# Patient Record
Sex: Male | Born: 1995 | Race: White | Hispanic: No | Smoking: Never smoker
Health system: Southern US, Community
[De-identification: ages and names within clinical notes are randomized; demographics above are authoritative.]

## PROBLEM LIST (undated history)

## (undated) ENCOUNTER — Ambulatory Visit: Admission: EM | Source: Home / Self Care

## (undated) DIAGNOSIS — M069 Rheumatoid arthritis, unspecified: Secondary | ICD-10-CM

## (undated) DIAGNOSIS — K5100 Ulcerative (chronic) pancolitis without complications: Secondary | ICD-10-CM

## (undated) HISTORY — DX: Rheumatoid arthritis, unspecified: M06.9

## (undated) HISTORY — PX: COLONOSCOPY: SHX174

---
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
## 2017-02-07 DIAGNOSIS — M2550 Pain in unspecified joint: Secondary | ICD-10-CM | POA: Diagnosis not present

## 2017-02-07 DIAGNOSIS — K51818 Other ulcerative colitis with other complication: Secondary | ICD-10-CM | POA: Diagnosis not present

## 2017-03-13 DIAGNOSIS — F411 Generalized anxiety disorder: Secondary | ICD-10-CM | POA: Diagnosis not present

## 2017-03-13 DIAGNOSIS — F321 Major depressive disorder, single episode, moderate: Secondary | ICD-10-CM | POA: Diagnosis not present

## 2017-03-27 DIAGNOSIS — F321 Major depressive disorder, single episode, moderate: Secondary | ICD-10-CM | POA: Diagnosis not present

## 2017-03-27 DIAGNOSIS — F411 Generalized anxiety disorder: Secondary | ICD-10-CM | POA: Diagnosis not present

## 2017-04-24 DIAGNOSIS — F411 Generalized anxiety disorder: Secondary | ICD-10-CM | POA: Diagnosis not present

## 2017-04-24 DIAGNOSIS — F321 Major depressive disorder, single episode, moderate: Secondary | ICD-10-CM | POA: Diagnosis not present

## 2017-04-26 DIAGNOSIS — R300 Dysuria: Secondary | ICD-10-CM | POA: Diagnosis not present

## 2017-04-26 DIAGNOSIS — N390 Urinary tract infection, site not specified: Secondary | ICD-10-CM | POA: Diagnosis not present

## 2017-05-11 DIAGNOSIS — F321 Major depressive disorder, single episode, moderate: Secondary | ICD-10-CM | POA: Diagnosis not present

## 2017-05-11 DIAGNOSIS — F411 Generalized anxiety disorder: Secondary | ICD-10-CM | POA: Diagnosis not present

## 2017-05-25 DIAGNOSIS — F411 Generalized anxiety disorder: Secondary | ICD-10-CM | POA: Diagnosis not present

## 2017-05-25 DIAGNOSIS — F321 Major depressive disorder, single episode, moderate: Secondary | ICD-10-CM | POA: Diagnosis not present

## 2017-06-07 DIAGNOSIS — K51818 Other ulcerative colitis with other complication: Secondary | ICD-10-CM | POA: Diagnosis not present

## 2017-06-07 DIAGNOSIS — M2550 Pain in unspecified joint: Secondary | ICD-10-CM | POA: Diagnosis not present

## 2017-06-07 DIAGNOSIS — M545 Low back pain: Secondary | ICD-10-CM | POA: Diagnosis not present

## 2017-06-08 DIAGNOSIS — F321 Major depressive disorder, single episode, moderate: Secondary | ICD-10-CM | POA: Diagnosis not present

## 2017-06-08 DIAGNOSIS — F411 Generalized anxiety disorder: Secondary | ICD-10-CM | POA: Diagnosis not present

## 2017-06-22 DIAGNOSIS — F321 Major depressive disorder, single episode, moderate: Secondary | ICD-10-CM | POA: Diagnosis not present

## 2017-06-22 DIAGNOSIS — F411 Generalized anxiety disorder: Secondary | ICD-10-CM | POA: Diagnosis not present

## 2017-06-27 DIAGNOSIS — F321 Major depressive disorder, single episode, moderate: Secondary | ICD-10-CM | POA: Diagnosis not present

## 2017-06-27 DIAGNOSIS — F411 Generalized anxiety disorder: Secondary | ICD-10-CM | POA: Diagnosis not present

## 2017-07-06 DIAGNOSIS — F411 Generalized anxiety disorder: Secondary | ICD-10-CM | POA: Diagnosis not present

## 2017-07-06 DIAGNOSIS — F321 Major depressive disorder, single episode, moderate: Secondary | ICD-10-CM | POA: Diagnosis not present

## 2017-07-11 DIAGNOSIS — F411 Generalized anxiety disorder: Secondary | ICD-10-CM | POA: Diagnosis not present

## 2017-07-11 DIAGNOSIS — F900 Attention-deficit hyperactivity disorder, predominantly inattentive type: Secondary | ICD-10-CM | POA: Diagnosis not present

## 2017-07-20 DIAGNOSIS — F321 Major depressive disorder, single episode, moderate: Secondary | ICD-10-CM | POA: Diagnosis not present

## 2017-07-20 DIAGNOSIS — F411 Generalized anxiety disorder: Secondary | ICD-10-CM | POA: Diagnosis not present

## 2017-07-30 DIAGNOSIS — F411 Generalized anxiety disorder: Secondary | ICD-10-CM | POA: Diagnosis not present

## 2017-07-30 DIAGNOSIS — F321 Major depressive disorder, single episode, moderate: Secondary | ICD-10-CM | POA: Diagnosis not present

## 2017-08-08 DIAGNOSIS — F321 Major depressive disorder, single episode, moderate: Secondary | ICD-10-CM | POA: Diagnosis not present

## 2017-08-08 DIAGNOSIS — F411 Generalized anxiety disorder: Secondary | ICD-10-CM | POA: Diagnosis not present

## 2017-08-15 DIAGNOSIS — F411 Generalized anxiety disorder: Secondary | ICD-10-CM | POA: Diagnosis not present

## 2017-08-15 DIAGNOSIS — F321 Major depressive disorder, single episode, moderate: Secondary | ICD-10-CM | POA: Diagnosis not present

## 2017-08-20 DIAGNOSIS — F411 Generalized anxiety disorder: Secondary | ICD-10-CM | POA: Diagnosis not present

## 2017-08-20 DIAGNOSIS — F321 Major depressive disorder, single episode, moderate: Secondary | ICD-10-CM | POA: Diagnosis not present

## 2017-09-12 DIAGNOSIS — F411 Generalized anxiety disorder: Secondary | ICD-10-CM | POA: Diagnosis not present

## 2017-09-12 DIAGNOSIS — F321 Major depressive disorder, single episode, moderate: Secondary | ICD-10-CM | POA: Diagnosis not present

## 2018-06-13 DIAGNOSIS — Z681 Body mass index (BMI) 19 or less, adult: Secondary | ICD-10-CM | POA: Diagnosis not present

## 2018-06-13 DIAGNOSIS — R509 Fever, unspecified: Secondary | ICD-10-CM | POA: Diagnosis not present

## 2018-06-13 DIAGNOSIS — H6123 Impacted cerumen, bilateral: Secondary | ICD-10-CM | POA: Diagnosis not present

## 2018-06-13 DIAGNOSIS — Z20828 Contact with and (suspected) exposure to other viral communicable diseases: Secondary | ICD-10-CM | POA: Diagnosis not present

## 2018-06-13 DIAGNOSIS — R8290 Unspecified abnormal findings in urine: Secondary | ICD-10-CM | POA: Diagnosis not present

## 2018-11-09 ENCOUNTER — Ambulatory Visit
Admission: EM | Admit: 2018-11-09 | Discharge: 2018-11-09 | Disposition: A | Discharge status: Home / Self Care | Payer: BC Managed Care – PPO | Attending: Emergency Medicine | Admitting: Emergency Medicine

## 2018-11-09 ENCOUNTER — Ambulatory Visit (INDEPENDENT_AMBULATORY_CARE_PROVIDER_SITE_OTHER): Payer: BC Managed Care – PPO

## 2018-11-09 DIAGNOSIS — S29012A Strain of muscle and tendon of back wall of thorax, initial encounter: Secondary | ICD-10-CM | POA: Diagnosis not present

## 2018-11-09 DIAGNOSIS — M546 Pain in thoracic spine: Secondary | ICD-10-CM | POA: Diagnosis not present

## 2018-11-09 DIAGNOSIS — S299XXA Unspecified injury of thorax, initial encounter: Secondary | ICD-10-CM | POA: Diagnosis not present

## 2018-11-09 MED ORDER — CYCLOBENZAPRINE HCL 5 MG PO TABS: 5.0000 mg | ORAL_TABLET | Freq: Two times a day (BID) | ORAL | 0 refills | Status: AC | PRN

## 2018-11-09 MED ORDER — PREDNISONE 10 MG (21) PO TBPK: ORAL_TABLET | Freq: Every day | ORAL | 0 refills | Status: DC

## 2018-11-09 NOTE — Discharge Instructions (Signed)
Do stretches/exercises provided. May use ice topically swelling. Use steroid pack as discussed at appointment/written. May take muscle relaxer before bedtime for severe pain/tightness. Follow-up with Ortho: Go to ER for further evaluation if you develop severe fatigue, nausea, chest pain, difficulty breathing, abdominal pain, upper or lower extremity weakness, numbness.

## 2018-11-09 NOTE — ED Triage Notes (Signed)
Pt c/o upper back pain shooting down center of back. States felt the pain while rotating tires on his car

## 2018-11-09 NOTE — ED Provider Notes (Signed)
[facility]    CSN: [id number] Arrival date & time: 11/09/18  1212     History   Chief Complaint Chief Complaint  Patient presents with  . Back Pain    HPI Dennis Mccarty is a 23 y.o. male presenting for fiery/burning midthoracic back pain between his shoulder blades that is worse when raising either arm greater than 90 degrees extension since 830 this morning after feeling a popping sensation when rotating his tires.  Patient states that he took half of his tramadol, for which he takes due to UC and RA flares.  Does endorse fatigue and mild nausea since taking tramadol, though patient states this is consistent with known side effect of medication.  Patient denies history of connective tissue disorder, family history of connective tissue disorder.  Patient denies chest pain, shortness of breath, visual changes, loss of consciousness, urinary or fecal incontinence, lower extremity paresthesias or weakness.    History reviewed. No pertinent past medical history.  There are no active problems to display for this patient.   History reviewed. No pertinent surgical history.     Home Medications    Prior to Admission medications   Medication Sig Start Date End Date Taking? Authorizing Provider  cyclobenzaprine (FLEXERIL) 5 MG tablet Take 1 tablet (5 mg total) by mouth 2 (two) times daily as needed for up to 5 days for muscle spasms. 11/09/18 11/14/18  Hall-Potvin, [place], PA-C  predniSONE (STERAPRED UNI-PAK 21 TAB) 10 MG (21) TBPK tablet Take by mouth daily. Take steroid pack as written 11/09/18   Hall-Potvin, [place], PA-C    Family History No family history on file.  Social History Social History   Tobacco Use  . Smoking status: Never Smoker  . Smokeless tobacco: Never Used  Substance Use Topics  . Alcohol use: Not Currently  . Drug use: Not on file     Allergies   Patient has no known allergies.   Review of Systems Review of Systems  Constitutional:  Negative for fatigue and fever.  Respiratory: Negative for cough and shortness of breath.   Cardiovascular: Negative for chest pain and palpitations.  Gastrointestinal: Negative for abdominal pain, diarrhea and vomiting.  Musculoskeletal: Positive for back pain. Negative for arthralgias, gait problem, myalgias and neck stiffness.  Skin: Negative for rash and wound.  Neurological: Negative for speech difficulty and headaches.  All other systems reviewed and are negative.    Physical Exam Triage Vital Signs ED Triage Vitals  Enc Vitals Group     BP      Pulse      Resp      Temp      Temp src      SpO2      Weight      Height      Head Circumference      Peak Flow      Pain Score      Pain Loc      Pain Edu?      Excl. in [place]?    No data found.  Updated Vital Signs BP 106/65 (BP Location: Left Arm)   Pulse 77   Temp 98.3 F (36.8 C) (Oral)   Resp 18   SpO2 96%   Visual Acuity Right Eye Distance:   Left Eye Distance:   Bilateral Distance:    Right Eye Near:   Left Eye Near:    Bilateral Near:     Physical Exam Vitals signs reviewed.  Constitutional:  General: He is not in acute distress.    Appearance: He is not toxic-appearing or diaphoretic.     Comments: Patient is very lean  HENT:     Head: Normocephalic and atraumatic.     Right Ear: Tympanic membrane, ear canal and external ear normal.     Left Ear: Tympanic membrane, ear canal and external ear normal.     Nose: Nose normal.     Mouth/Throat:     Mouth: Mucous membranes are moist.     Pharynx: Oropharynx is clear. No oropharyngeal exudate or posterior oropharyngeal erythema.  Eyes:     General: No scleral icterus.       Right eye: No discharge.        Left eye: No discharge.     Extraocular Movements: Extraocular movements intact.     Conjunctiva/sclera: Conjunctivae normal.     Pupils: Pupils are equal, round, and reactive to light.  Neck:     Musculoskeletal: Normal range of motion and  neck supple. No neck rigidity or muscular tenderness.  Cardiovascular:     Rate and Rhythm: Normal rate and regular rhythm.     Pulses: Normal pulses.     Comments: Patient sitting, BP: Left arm 102/64, right arm 103/67  Pulmonary:     Effort: Pulmonary effort is normal. No respiratory distress.     Comments: Decreased breath sounds bilaterally without rales, wheezing, rhonchi or prolonged expiratory phase Chest:     Chest wall: No tenderness.  Abdominal:     General: Abdomen is flat. Bowel sounds are normal.     Palpations: Abdomen is soft.     Tenderness: There is no abdominal tenderness. There is no right CVA tenderness, left CVA tenderness, guarding or rebound.  Musculoskeletal:     Comments: Limited shoulder extension bilaterally second to pain.  Moderate thoracic vertebral tenderness with left-sided paravertebral tenderness and swelling.  Grip strength 5/5, NVI.  Lower extremity strength 5/5, gait normal.  Lymphadenopathy:     Cervical: No cervical adenopathy.  Skin:    General: Skin is warm.     Capillary Refill: Capillary refill takes less than 2 seconds.     Coloration: Skin is not jaundiced or pale.     Findings: No bruising.  Neurological:     General: No focal deficit present.     Mental Status: He is alert and oriented to person, place, and time.     Cranial Nerves: No cranial nerve deficit.     Sensory: No sensory deficit.     Motor: No weakness.     Coordination: Coordination normal.     Gait: Gait normal.     Deep Tendon Reflexes: Reflexes normal.  Psychiatric:        Thought Content: Thought content normal.        Judgment: Judgment normal.      UC Treatments / Results  Labs (all labs ordered are listed, but only abnormal results are displayed) Labs Reviewed - No data to display  EKG   Radiology Dg Chest 2 View  Result Date: 11/09/2018 CLINICAL DATA:  Thoracic spine pain after lifting injury this morning. Initial encounter. EXAM: CHEST - 2 VIEW  COMPARISON:  None. FINDINGS: Lungs clear. Heart size normal. No pneumothorax or pleural fluid. No bony abnormality. IMPRESSION: Normal chest. Electronically Signed   By: Drusilla Kannerhomas  Dalessio M.D.   On: 11/09/2018 13:12    Procedures Procedures (including critical care time)  Medications Ordered in UC Medications - No data to  display  Initial Impression / Assessment and Plan / UC Course  I have reviewed the triage vital signs and the nursing notes.  Pertinent labs & imaging results that were available during my care of the patient were reviewed by me and considered in my medical decision making (see chart for details).     1.  Thoracic back strain Chest x-ray done in office due to vertebral tenderness status post popping sensation and decreased breath sounds bilaterally.  Reviewed by myself and radiology: No bony abnormality, lungs are clear and without pneumothorax or pleural fluid.  Phenotypically, there is some concern for possible underlying connective tissue disorder, though patient is denies history of this as well as chest pain or shortness of breath - cardiovascular complications due to largely unremarkable physical exam, hemodynamic stability with vitals, and equal blood pressure on both arms.  Will provide low-dose muscle relaxer for spasm and hypertonicity that is predominantly on left side and steroid taper pack given inflammatory response to injury likely contributing to symptoms.  Patient will follow-up with sports medicine should symptoms not improve or worsen over the next week.  Return precautions discussed, patient verbalized understanding and is agreeable to plan.  Final Clinical Impressions(s) / UC Diagnoses   Final diagnoses:  Strain of thoracic back region    ED Prescriptions    Medication Sig Dispense Auth. Provider   predniSONE (STERAPRED UNI-PAK 21 TAB) 10 MG (21) TBPK tablet Take by mouth daily. Take steroid pack as written 21 tablet Hall-Potvin, [place], PA-C    cyclobenzaprine (FLEXERIL) 5 MG tablet Take 1 tablet (5 mg total) by mouth 2 (two) times daily as needed for up to 5 days for muscle spasms. 10 tablet Hall-Potvin, [place], PA-C     Controlled Substance Prescriptions [place] Controlled Substance Registry consulted? Not Applicable   Shea EvansHall-Potvin, , [place] JerseyPA-C 11/09/18 1718

## 2018-11-11 DIAGNOSIS — M546 Pain in thoracic spine: Secondary | ICD-10-CM | POA: Diagnosis not present

## 2018-11-27 DIAGNOSIS — M546 Pain in thoracic spine: Secondary | ICD-10-CM | POA: Diagnosis not present

## 2019-02-10 DIAGNOSIS — Z0000 Encounter for general adult medical examination without abnormal findings: Secondary | ICD-10-CM | POA: Diagnosis not present

## 2019-02-10 DIAGNOSIS — K51919 Ulcerative colitis, unspecified with unspecified complications: Secondary | ICD-10-CM | POA: Diagnosis not present

## 2019-02-10 DIAGNOSIS — Z681 Body mass index (BMI) 19 or less, adult: Secondary | ICD-10-CM | POA: Diagnosis not present

## 2019-02-10 DIAGNOSIS — F330 Major depressive disorder, recurrent, mild: Secondary | ICD-10-CM | POA: Diagnosis not present

## 2019-02-17 DIAGNOSIS — K51919 Ulcerative colitis, unspecified with unspecified complications: Secondary | ICD-10-CM | POA: Diagnosis not present

## 2019-04-05 ENCOUNTER — Ambulatory Visit
Admission: EM | Admit: 2019-04-05 | Discharge: 2019-04-05 | Disposition: A | Discharge status: Home / Self Care | Payer: BC Managed Care – PPO | Attending: Emergency Medicine | Admitting: Emergency Medicine

## 2019-04-05 ENCOUNTER — Other Ambulatory Visit: Payer: Self-pay

## 2019-04-05 ENCOUNTER — Encounter: Payer: Self-pay | Admitting: Emergency Medicine

## 2019-04-05 DIAGNOSIS — H9201 Otalgia, right ear: Secondary | ICD-10-CM | POA: Diagnosis not present

## 2019-04-05 DIAGNOSIS — H6121 Impacted cerumen, right ear: Secondary | ICD-10-CM

## 2019-04-05 DIAGNOSIS — Z20828 Contact with and (suspected) exposure to other viral communicable diseases: Secondary | ICD-10-CM

## 2019-04-05 DIAGNOSIS — M791 Myalgia, unspecified site: Secondary | ICD-10-CM | POA: Diagnosis not present

## 2019-04-05 HISTORY — DX: Ulcerative (chronic) pancolitis without complications: K51.00

## 2019-04-05 MED ORDER — AMOXICILLIN-POT CLAVULANATE 875-125 MG PO TABS: 1.0000 | ORAL_TABLET | Freq: Two times a day (BID) | ORAL | 0 refills | Status: DC

## 2019-04-05 NOTE — Discharge Instructions (Addendum)
Your COVID test is pending - it is important to quarantine / isolate at home until your results are back. °If you test positive and would like further evaluation for persistent or worsening symptoms, you may schedule an E-visit or virtual (video) visit throughout the  MyChart app or website. ° °PLEASE NOTE: If you develop severe chest pain or shortness of breath please go to the ER or call 9-1-1 for further evaluation --> DO NOT schedule electronic or virtual visits for this. °Please call our office for further guidance / recommendations as needed. °

## 2019-04-05 NOTE — ED Provider Notes (Addendum)
Dennis Mccarty    CSN: [id number] Arrival date & time: 04/05/19  8242      History   Chief Complaint Chief Complaint  Patient presents with  . APPT: 10am  . URI    HPI Dennis Mccarty is a 23 y.o. male presenting for 4-day course of nasal congestion with 2-day course of generalized, mild headache, myalgias, right ear pain, dry/scratchy throat.  Patient does endorse postnasal drip.  Patient denies known sick exposures, fever, cough, shortness of breath.  Has not taken anything for symptoms.     Past Medical History:  Diagnosis Date  . Ulcerative (chronic) enterocolitis (HCC)     There are no problems to display for this patient.   History reviewed. No pertinent surgical history.     Home Medications    Prior to Admission medications   Medication Sig Start Date End Date Taking? Authorizing Provider  amoxicillin-clavulanate (AUGMENTIN) 875-125 MG tablet Take 1 tablet by mouth every 12 (twelve) hours. 04/05/19   Hall-Potvin, [place], PA-C    Family History Family History  Problem Relation Age of Onset  . Healthy Mother   . Hypotension Father     Social History Social History   Tobacco Use  . Smoking status: Never Smoker  . Smokeless tobacco: Never Used  Substance Use Topics  . Alcohol use: Not Currently  . Drug use: Not on file     Allergies   Patient has no known allergies.   Review of Systems Review of Systems  Constitutional: Negative for activity change, appetite change, fatigue and fever.  HENT: Positive for congestion, ear pain, postnasal drip and sore throat. Negative for dental problem, ear discharge, facial swelling, hearing loss, sinus pain, trouble swallowing and voice change.   Eyes: Negative for photophobia, pain and visual disturbance.  Respiratory: Negative for cough, shortness of breath and wheezing.   Cardiovascular: Negative for chest pain and palpitations.  Gastrointestinal: Negative for abdominal pain, diarrhea and  vomiting.  Musculoskeletal: Positive for myalgias. Negative for arthralgias, gait problem, neck pain and neck stiffness.  Skin: Negative for rash and wound.  Neurological: Positive for headaches. Negative for dizziness, tremors, facial asymmetry, speech difficulty, weakness, light-headedness and numbness.  All other systems reviewed and are negative.    Physical Exam Triage Vital Signs ED Triage Vitals [04/05/19 1014]  Enc Vitals Group     BP 110/70     Pulse Rate 100     Resp 18     Temp 98.6 F (37 C)     Temp Source Temporal     SpO2 97 %     Weight      Height      Head Circumference      Peak Flow      Pain Score 8     Pain Loc      Pain Edu?      Excl. in [place]?    No data found.  Updated Vital Signs BP 110/70 (BP Location: Left Arm)   Pulse 100   Temp 98.6 F (37 C) (Temporal)   Resp 18   SpO2 97%   Visual Acuity Right Eye Distance:   Left Eye Distance:   Bilateral Distance:    Right Eye Near:   Left Eye Near:    Bilateral Near:     Physical Exam Constitutional:      General: He is not in acute distress.    Appearance: He is not toxic-appearing.  HENT:     Head:  Normocephalic and atraumatic.     Right Ear: There is no impacted cerumen.     Left Ear: External ear normal. There is no impacted cerumen.     Ears:     Comments: Right tragal tenderness, EACs nonedematous bilaterally.  Cerumen is thick, waxy and unable to be removed by this provider with curette    Nose: Nose normal.     Mouth/Throat:     Mouth: Mucous membranes are moist.     Pharynx: Oropharynx is clear. No oropharyngeal exudate or posterior oropharyngeal erythema.     Comments: No tonsillar hypertrophy Eyes:     General: No scleral icterus.    Conjunctiva/sclera: Conjunctivae normal.     Pupils: Pupils are equal, round, and reactive to light.  Neck:     Comments: Right-sided anterior cervical lymphadenopathy Cardiovascular:     Rate and Rhythm: Normal rate.  Pulmonary:      Effort: Pulmonary effort is normal. No respiratory distress.     Breath sounds: No stridor. No wheezing or rhonchi.  Musculoskeletal:     Cervical back: Normal range of motion and neck supple. Tenderness present.  Lymphadenopathy:     Cervical: Cervical adenopathy present.  Skin:    Capillary Refill: Capillary refill takes less than 2 seconds.     Coloration: Skin is not jaundiced or pale.  Neurological:     Mental Status: He is alert and oriented to person, place, and time.      UC Treatments / Results  Labs (all labs ordered are listed, but only abnormal results are displayed) Labs Reviewed  NOVEL CORONAVIRUS, NAA   Narrative:    Performed at:  [address] [place], [place]  [id number] Lab Director: Jolene Schimke MD, Phone:  [phone]    EKG   Radiology No results found.  Procedures Procedures (including critical Mccarty time)  Medications Ordered in UC Medications - No data to display  Initial Impression / Assessment and Plan / UC Course  I have reviewed the triage vital signs and the nursing notes.  Pertinent labs & imaging results that were available during my Mccarty of the patient were reviewed by me and considered in my medical decision making (see chart for details).     Patient afebrile, nontoxic, with SpO2 97%.  Unable to remove thick, waxy cerumen in office: Will start Augmentin for likely right inner ear infection.  Given myalgias in setting of global pandemic Covid PCR pending.  Patient to quarantine until results are back.  We will continue supportive management.  Return precautions discussed, patient verbalized understanding and is agreeable to plan. Final Clinical Impressions(s) / UC Diagnoses   Final diagnoses:  Ear pain, right  Myalgia     Discharge Instructions     Your COVID test is pending - it is important to quarantine / isolate at home until your results are back. If you test positive and would like further  evaluation for persistent or worsening symptoms, you may schedule an E-visit or virtual (video) visit throughout the [facility] app or website.  PLEASE NOTE: If you develop severe chest pain or shortness of breath please go to the ER or call 9-1-1 for further evaluation --> DO NOT schedule electronic or virtual visits for this. Please call our office for further guidance / recommendations as needed.    ED Prescriptions    Medication Sig Dispense Auth. Provider   amoxicillin-clavulanate (AUGMENTIN) 875-125 MG tablet Take 1 tablet by mouth every 12 (twelve)  hours. 14 tablet Hall-Potvin, [place], PA-C     PDMP not reviewed this encounter.   Hall-Potvin, [place], PA-C 04/06/19 1757    Hall-Potvin, [place], PA-C 04/08/19 0900

## 2019-04-05 NOTE — ED Notes (Signed)
Patient able to ambulate independently  

## 2019-04-05 NOTE — ED Triage Notes (Addendum)
Pt presents to [facility] for assessment of nasal congestion x 4 days, and now 2 days of headache, body aches, right ear pain, dry scratchy throat.  Pt believes these symptoms to be related to an ear infection, wanted to wait on COVID test until provider sees him.

## 2019-04-06 LAB — NOVEL CORONAVIRUS, NAA: SARS-CoV-2, NAA: NOT DETECTED

## 2019-05-12 ENCOUNTER — Ambulatory Visit
Admission: EM | Admit: 2019-05-12 | Discharge: 2019-05-12 | Disposition: A | Discharge status: Home / Self Care | Payer: BC Managed Care – PPO | Attending: Physician Assistant | Admitting: Physician Assistant

## 2019-05-12 DIAGNOSIS — Z20822 Contact with and (suspected) exposure to covid-19: Secondary | ICD-10-CM

## 2019-05-12 DIAGNOSIS — R52 Pain, unspecified: Secondary | ICD-10-CM

## 2019-05-12 DIAGNOSIS — R0981 Nasal congestion: Secondary | ICD-10-CM

## 2019-05-12 MED ORDER — FLUTICASONE PROPIONATE 50 MCG/ACT NA SUSP: 2.0000 | Freq: Every day | NASAL | 0 refills | Status: AC

## 2019-05-12 NOTE — ED Triage Notes (Signed)
Pt states had a positive covid exposure 3 days ago. C/o headache, congestion and chills in past 24hrs.

## 2019-05-12 NOTE — Discharge Instructions (Signed)
COVID PCR testing ordered. I would like you to quarantine until testing results. You can take over the counter flonase/nasacort to help with nasal congestion/drainage. Tylenol/motrin for pain and fever. Keep hydrated, urine should be clear to pale yellow in color. If experiencing shortness of breath, trouble breathing, go to the emergency department for further evaluation needed.  

## 2019-05-12 NOTE — ED Provider Notes (Addendum)
[facility]    CSN: [id number] Arrival date & time: 05/12/19  1034      History   Chief Complaint Chief Complaint  Patient presents with  . Headache    HPI Dennis Mccarty is a 24 y.o. male.   24 year old male comes in for 2 day of URI symptoms. Has had headache, congestion/rhinorrhea, hot and cold chills, body aches. No cough. Denies fever. Denies abdominal pain, nausea, vomiting, diarrhea. Denies shortness of breath, loss of taste/smell. Never smoker. States had friend over who was tested positive for COVID, but had finished quarantine period.   Patient with history of UC, not currently on any immunosuppressants.      Past Medical History:  Diagnosis Date  . Ulcerative (chronic) enterocolitis (HCC)     There are no problems to display for this patient.   History reviewed. No pertinent surgical history.     Home Medications    Prior to Admission medications   Medication Sig Start Date End Date Taking? Authorizing Provider  fluticasone (FLONASE) 50 MCG/ACT nasal spray Place 2 sprays into both nostrils daily. 05/12/19   Belinda Fisher, PA-C    Family History Family History  Problem Relation Age of Onset  . Healthy Mother   . Hypotension Father     Social History Social History   Tobacco Use  . Smoking status: Never Smoker  . Smokeless tobacco: Never Used  Substance Use Topics  . Alcohol use: Not Currently  . Drug use: Never     Allergies   Patient has no known allergies.   Review of Systems Review of Systems  Reason unable to perform ROS: See HPI as above.     Physical Exam Triage Vital Signs ED Triage Vitals  Enc Vitals Group     BP 05/12/19 1047 110/72     Pulse Rate 05/12/19 1047 84     Resp 05/12/19 1047 16     Temp 05/12/19 1047 98.4 F (36.9 C)     Temp Source 05/12/19 1047 Oral     SpO2 05/12/19 1047 97 %     Weight --      Height --      Head Circumference --      Peak Flow --      Pain Score 05/12/19 1048 4     Pain  Loc --      Pain Edu? --      Excl. in [place]? --    No data found.  Updated Vital Signs BP 110/72 (BP Location: Left Arm)   Pulse 84   Temp 98.4 F (36.9 C) (Oral)   Resp 16   SpO2 97%   Physical Exam Constitutional:      General: He is not in acute distress.    Appearance: Normal appearance. He is not ill-appearing, toxic-appearing or diaphoretic.  HENT:     Head: Normocephalic and atraumatic.     Mouth/Throat:     Mouth: Mucous membranes are moist.     Pharynx: Oropharynx is clear. Uvula midline.  Cardiovascular:     Rate and Rhythm: Normal rate and regular rhythm.     Heart sounds: Normal heart sounds. No murmur. No friction rub. No gallop.   Pulmonary:     Effort: Pulmonary effort is normal. No accessory muscle usage, prolonged expiration, respiratory distress or retractions.     Comments: Lungs clear to auscultation without adventitious lung sounds. Musculoskeletal:     Cervical back: Normal range of motion  and neck supple.  Neurological:     General: No focal deficit present.     Mental Status: He is alert and oriented to person, place, and time.      UC Treatments / Results  Labs (all labs ordered are listed, but only abnormal results are displayed) Labs Reviewed  NOVEL CORONAVIRUS, NAA    EKG   Radiology No results found.  Procedures Procedures (including critical care time)  Medications Ordered in UC Medications - No data to display  Initial Impression / Assessment and Plan / UC Course  I have reviewed the triage vital signs and the nursing notes.  Pertinent labs & imaging results that were available during my care of the patient were reviewed by me and considered in my medical decision making (see chart for details).    COVID PCR test ordered. Patient to quarantine until testing results return. No alarming signs on exam.  Patient speaking in full sentences without respiratory distress.  Symptomatic treatment discussed.  Push fluids.  Return  precautions given.  Patient expresses understanding and agrees to plan.  Final Clinical Impressions(s) / UC Diagnoses   Final diagnoses:  Nasal congestion  Body aches   ED Prescriptions    Medication Sig Dispense Auth. Provider   fluticasone (FLONASE) 50 MCG/ACT nasal spray Place 2 sprays into both nostrils daily. 1 g Ok Edwards, PA-C     PDMP not reviewed this encounter.   Ok Edwards, PA-C 05/12/19 1105    [address] V, PA-C 05/12/19 1105

## 2019-05-13 LAB — NOVEL CORONAVIRUS, NAA: SARS-CoV-2, NAA: NOT DETECTED

## 2021-05-25 ENCOUNTER — Emergency Department (HOSPITAL_BASED_OUTPATIENT_CLINIC_OR_DEPARTMENT_OTHER): Payer: BC Managed Care – PPO

## 2021-05-25 ENCOUNTER — Encounter (HOSPITAL_BASED_OUTPATIENT_CLINIC_OR_DEPARTMENT_OTHER): Payer: Self-pay

## 2021-05-25 ENCOUNTER — Other Ambulatory Visit: Payer: Self-pay

## 2021-05-25 ENCOUNTER — Emergency Department (HOSPITAL_BASED_OUTPATIENT_CLINIC_OR_DEPARTMENT_OTHER)
Admission: EM | Admit: 2021-05-25 | Discharge: 2021-05-25 | Disposition: A | Discharge status: Home / Self Care | Payer: BC Managed Care – PPO | Attending: Emergency Medicine | Admitting: Emergency Medicine

## 2021-05-25 DIAGNOSIS — Y92410 Unspecified street and highway as the place of occurrence of the external cause: Secondary | ICD-10-CM | POA: Diagnosis not present

## 2021-05-25 DIAGNOSIS — M25562 Pain in left knee: Secondary | ICD-10-CM | POA: Diagnosis not present

## 2021-05-25 MED ORDER — METHOCARBAMOL 500 MG PO TABS: 500.0000 mg | ORAL_TABLET | Freq: Two times a day (BID) | ORAL | 0 refills | Status: AC

## 2021-05-25 MED ORDER — NAPROXEN 500 MG PO TABS: 500.0000 mg | ORAL_TABLET | Freq: Two times a day (BID) | ORAL | 0 refills | Status: AC
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
## 2022-11-22 ENCOUNTER — Encounter: Payer: Self-pay | Admitting: *Deleted

## 2022-11-22 ENCOUNTER — Ambulatory Visit
Admission: EM | Admit: 2022-11-22 | Discharge: 2022-11-22 | Disposition: A | Discharge status: Home / Self Care | Payer: BC Managed Care – PPO | Attending: Family Medicine | Admitting: Family Medicine

## 2022-11-22 ENCOUNTER — Other Ambulatory Visit: Payer: Self-pay

## 2022-11-22 DIAGNOSIS — B349 Viral infection, unspecified: Secondary | ICD-10-CM | POA: Diagnosis present

## 2022-11-22 DIAGNOSIS — Z20822 Contact with and (suspected) exposure to covid-19: Secondary | ICD-10-CM | POA: Diagnosis present

## 2022-11-22 MED ORDER — ONDANSETRON HCL 8 MG PO TABS: 8.0000 mg | ORAL_TABLET | Freq: Three times a day (TID) | ORAL | 0 refills | Status: AC | PRN

## 2022-11-22 NOTE — ED Triage Notes (Addendum)
C/O nausea, vomiting, diarrhea onset yesterday. Reports slight cough last night. Reports temps of 99.7 -- taking IBU (last dose @ 0930). Has not been able to keep down crackers, but is able to keep down PO fluids. Pt declines ondansetron at this time.

## 2022-11-22 NOTE — Discharge Instructions (Addendum)
COVID test pending. Symptom management warranted only.  Manage fever with Tylenol and ibuprofen.  Treatment per discharge medications/discharge instructions.  Zofran prescribed for nausea.

## 2022-11-22 NOTE — ED Provider Notes (Signed)
[facility]    CSN: [id number] Arrival date & time: 11/22/22  1023      History   Chief Complaint Chief Complaint  Patient presents with   Emesis    HPI SLADE ROEPKE is a 27 y.o. male, hx of RA and UCC  HPI Patient presents for evaluation of nausea, vomiting, or diarrhea. Temperature 99.7, taking Ibuprofen. Unable to keep any solids down. Able to tolerate fluids. Reports low grade temperature today and took ibuprofen. Currently afebrile.  Past Medical History:  Diagnosis Date   Rheumatoid arthritis (HCC)    Ulcerative (chronic) enterocolitis (HCC)     There are no problems to display for this patient.   Past Surgical History:  Procedure Laterality Date   COLONOSCOPY     age 74 and age 38       Home Medications    Prior to Admission medications   Medication Sig Start Date End Date Taking? Authorizing Provider  Fexofenadine HCl (ALLEGRA PO) Take by mouth.   Yes [provider]  fluticasone (FLONASE) 50 MCG/ACT nasal spray Place 2 sprays into both nostrils daily. 05/12/19   Cathie Hoops, Amy V, PA-C  methocarbamol (ROBAXIN) 500 MG tablet Take 1 tablet (500 mg total) by mouth 2 (two) times daily. 05/25/21   Mannie Stabile, PA-C  naproxen (NAPROSYN) 500 MG tablet Take 1 tablet (500 mg total) by mouth 2 (two) times daily. 05/25/21   Mannie Stabile, PA-C    Family History Family History  Problem Relation Age of Onset   Healthy Mother    Hypotension Father     Social History Social History   Tobacco Use   Smoking status: Never   Smokeless tobacco: Never  Vaping Use   Vaping status: Never Used  Substance Use Topics   Alcohol use: Not Currently   Drug use: Never     Allergies   Patient has no known allergies.   Review of Systems Review of Systems Pertinent negatives listed in HPI   Physical Exam Triage Vital Signs ED Triage Vitals  Encounter Vitals Group     BP 11/22/22 1124 109/76     Systolic BP Percentile --       Diastolic BP Percentile --      Pulse Rate 11/22/22 1124 73     Resp 11/22/22 1124 16     Temp 11/22/22 1124 97.9 F (36.6 C)     Temp Source 11/22/22 1124 Oral     SpO2 11/22/22 1124 97 %     Weight --      Height --      Head Circumference --      Peak Flow --      Pain Score 11/22/22 1125 0     Pain Loc --      Pain Education --      Exclude from Growth Chart --    No data found.  Updated Vital Signs BP 109/76   Pulse 73   Temp 97.9 F (36.6 C) (Oral)   Resp 16   SpO2 97%   Visual Acuity Right Eye Distance:   Left Eye Distance:   Bilateral Distance:    Right Eye Near:   Left Eye Near:    Bilateral Near:     Physical Exam Constitutional:      Appearance: He is ill-appearing.  HENT:     Head: Normocephalic and atraumatic.  Eyes:     Extraocular Movements: Extraocular movements intact.     Conjunctiva/sclera:  Conjunctivae normal.     Pupils: Pupils are equal, round, and reactive to light.  Cardiovascular:     Rate and Rhythm: Normal rate and regular rhythm.  Pulmonary:     Effort: Pulmonary effort is normal.     Breath sounds: Normal breath sounds.  Abdominal:     General: Abdomen is flat.  Musculoskeletal:     Cervical back: Normal range of motion and neck supple.  Skin:    General: Skin is warm and dry.     Capillary Refill: Capillary refill takes less than 2 seconds.  Neurological:     General: No focal deficit present.     Mental Status: He is alert.      UC Treatments / Results  Labs (all labs ordered are listed, but only abnormal results are displayed) Labs Reviewed  SARS CORONAVIRUS 2 (TAT 6-24 HRS)    EKG   Radiology No results found.  Procedures Procedures (including critical care time)  Medications Ordered in UC Medications - No data to display  Initial Impression / Assessment and Plan / UC Course  I have reviewed the triage vital signs and the nursing notes.  Pertinent labs & imaging results that were available during my  care of the patient were reviewed by me and considered in my medical decision making (see chart for details).    Covid 19 pending. Symptom management warranted only.  Manage fever with Tylenol and ibuprofen.  Treatment per discharge medications/discharge instructions.  Work note provided Final Clinical Impressions(s) / UC Diagnoses   Final diagnoses:  Encounter for laboratory testing for COVID-19 virus  Viral illness   Discharge Instructions   None    ED Prescriptions   None    PDMP not reviewed this encounter.   Bing Neighbors, NP 11/22/22 1235

## 2022-11-23 LAB — SARS CORONAVIRUS 2 (TAT 6-24 HRS): SARS Coronavirus 2: NEGATIVE
